# Patient Record
Sex: Male | Born: 1990 | Race: Black or African American | Hispanic: No | Marital: Married | Smoking: Current every day smoker
Health system: Southern US, Community
[De-identification: ages and names within clinical notes are randomized; demographics above are authoritative.]

## PROBLEM LIST (undated history)

## (undated) DIAGNOSIS — I10 Essential (primary) hypertension: Secondary | ICD-10-CM

## (undated) HISTORY — PX: FOOT SURGERY: SHX648

---
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
## 2011-10-26 ENCOUNTER — Emergency Department (HOSPITAL_COMMUNITY)
Admission: EM | Admit: 2011-10-26 | Discharge: 2011-10-26 | Disposition: A | Discharge status: Home / Self Care | Payer: No Typology Code available for payment source | Attending: Emergency Medicine | Admitting: Emergency Medicine

## 2011-10-26 ENCOUNTER — Emergency Department (HOSPITAL_COMMUNITY): Payer: Self-pay

## 2011-10-26 ENCOUNTER — Encounter (HOSPITAL_COMMUNITY): Payer: Self-pay | Admitting: *Deleted

## 2011-10-26 DIAGNOSIS — IMO0002 Reserved for concepts with insufficient information to code with codable children: Secondary | ICD-10-CM | POA: Insufficient documentation

## 2011-10-26 DIAGNOSIS — M25569 Pain in unspecified knee: Secondary | ICD-10-CM | POA: Insufficient documentation

## 2011-10-26 DIAGNOSIS — I10 Essential (primary) hypertension: Secondary | ICD-10-CM | POA: Insufficient documentation

## 2011-10-26 DIAGNOSIS — S8000XA Contusion of unspecified knee, initial encounter: Secondary | ICD-10-CM | POA: Insufficient documentation

## 2011-10-26 HISTORY — DX: Essential (primary) hypertension: I10

## 2011-10-26 MED ORDER — IBUPROFEN 600 MG PO TABS: 600.0000 mg | ORAL_TABLET | Freq: Four times a day (QID) | ORAL | Status: AC | PRN

## 2011-10-26 MED ORDER — TRAMADOL HCL 50 MG PO TABS: 50.0000 mg | ORAL_TABLET | Freq: Four times a day (QID) | ORAL | Status: AC | PRN
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
## 2016-09-17 ENCOUNTER — Other Ambulatory Visit (INDEPENDENT_AMBULATORY_CARE_PROVIDER_SITE_OTHER): Payer: PRIVATE HEALTH INSURANCE

## 2016-09-17 DIAGNOSIS — Z23 Encounter for immunization: Secondary | ICD-10-CM

## 2017-09-02 ENCOUNTER — Encounter: Payer: Self-pay | Admitting: Emergency Medicine

## 2017-09-02 ENCOUNTER — Emergency Department
Admission: EM | Admit: 2017-09-02 | Discharge: 2017-09-02 | Disposition: A | Discharge status: Home / Self Care | Payer: PRIVATE HEALTH INSURANCE | Attending: Emergency Medicine | Admitting: Emergency Medicine

## 2017-09-02 ENCOUNTER — Other Ambulatory Visit: Payer: Self-pay

## 2017-09-02 DIAGNOSIS — R1084 Generalized abdominal pain: Secondary | ICD-10-CM | POA: Insufficient documentation

## 2017-09-02 DIAGNOSIS — Z5321 Procedure and treatment not carried out due to patient leaving prior to being seen by health care provider: Secondary | ICD-10-CM | POA: Diagnosis not present

## 2017-09-02 DIAGNOSIS — R197 Diarrhea, unspecified: Secondary | ICD-10-CM | POA: Diagnosis not present

## 2017-09-02 DIAGNOSIS — R1110 Vomiting, unspecified: Secondary | ICD-10-CM | POA: Insufficient documentation

## 2017-09-02 LAB — COMPREHENSIVE METABOLIC PANEL
ALK PHOS: 68 U/L (ref 38–126)
ALT: 36 U/L (ref 17–63)
AST: 25 U/L (ref 15–41)
Albumin: 4.5 g/dL (ref 3.5–5.0)
Anion gap: 11 (ref 5–15)
BILIRUBIN TOTAL: 1.5 mg/dL — AB (ref 0.3–1.2)
BUN: 17 mg/dL (ref 6–20)
CALCIUM: 9 mg/dL (ref 8.9–10.3)
CO2: 22 mmol/L (ref 22–32)
CREATININE: 1.19 mg/dL (ref 0.61–1.24)
Chloride: 106 mmol/L (ref 101–111)
Glucose, Bld: 113 mg/dL — ABNORMAL HIGH (ref 65–99)
Potassium: 4.1 mmol/L (ref 3.5–5.1)
SODIUM: 139 mmol/L (ref 135–145)
Total Protein: 7.9 g/dL (ref 6.5–8.1)

## 2017-09-02 LAB — URINALYSIS, COMPLETE (UACMP) WITH MICROSCOPIC
Bacteria, UA: NONE SEEN
Bilirubin Urine: NEGATIVE
GLUCOSE, UA: NEGATIVE mg/dL
HGB URINE DIPSTICK: NEGATIVE
Ketones, ur: NEGATIVE mg/dL
Leukocytes, UA: NEGATIVE
Nitrite: NEGATIVE
PROTEIN: NEGATIVE mg/dL
Specific Gravity, Urine: 1.016 (ref 1.005–1.030)
Squamous Epithelial / LPF: NONE SEEN
pH: 7 (ref 5.0–8.0)

## 2017-09-02 LAB — LIPASE, BLOOD: Lipase: 20 U/L (ref 11–51)

## 2017-09-02 LAB — CBC
HCT: 46 % (ref 40.0–52.0)
Hemoglobin: 15.5 g/dL (ref 13.0–18.0)
MCH: 27.8 pg (ref 26.0–34.0)
MCHC: 33.7 g/dL (ref 32.0–36.0)
MCV: 82.4 fL (ref 80.0–100.0)
PLATELETS: 264 10*3/uL (ref 150–440)
RBC: 5.58 MIL/uL (ref 4.40–5.90)
RDW: 14.9 % — ABNORMAL HIGH (ref 11.5–14.5)
WBC: 12.1 10*3/uL — ABNORMAL HIGH (ref 3.8–10.6)

## 2017-09-02 NOTE — ED Triage Notes (Signed)
Pt reports vomiting, diarrhea and generalized abdominal pain after eating recalled hamburger meat two days ago.

## 2020-01-20 ENCOUNTER — Other Ambulatory Visit: Payer: Self-pay

## 2020-01-20 ENCOUNTER — Emergency Department (HOSPITAL_COMMUNITY)
Admission: EM | Admit: 2020-01-20 | Discharge: 2020-01-21 | Disposition: A | Discharge status: Home / Self Care | Payer: Self-pay | Attending: Emergency Medicine | Admitting: Emergency Medicine

## 2020-01-20 ENCOUNTER — Encounter (HOSPITAL_COMMUNITY): Payer: Self-pay | Admitting: Emergency Medicine

## 2020-01-20 DIAGNOSIS — R079 Chest pain, unspecified: Secondary | ICD-10-CM | POA: Insufficient documentation

## 2020-01-20 DIAGNOSIS — I10 Essential (primary) hypertension: Secondary | ICD-10-CM | POA: Insufficient documentation

## 2020-01-20 DIAGNOSIS — R1013 Epigastric pain: Secondary | ICD-10-CM | POA: Insufficient documentation

## 2020-01-20 DIAGNOSIS — F17210 Nicotine dependence, cigarettes, uncomplicated: Secondary | ICD-10-CM | POA: Insufficient documentation

## 2020-01-20 LAB — COMPREHENSIVE METABOLIC PANEL
ALT: 17 U/L (ref 0–44)
AST: 17 U/L (ref 15–41)
Albumin: 4.3 g/dL (ref 3.5–5.0)
Alkaline Phosphatase: 57 U/L (ref 38–126)
Anion gap: 8 (ref 5–15)
BUN: 14 mg/dL (ref 6–20)
CO2: 26 mmol/L (ref 22–32)
Calcium: 8.9 mg/dL (ref 8.9–10.3)
Chloride: 108 mmol/L (ref 98–111)
Creatinine, Ser: 1.47 mg/dL — ABNORMAL HIGH (ref 0.61–1.24)
GFR calc Af Amer: >60 mL/min (ref >60)
GFR calc non Af Amer: >60 mL/min (ref >60)
Glucose, Bld: 101 mg/dL — ABNORMAL HIGH (ref 70–99)
Potassium: 4.1 mmol/L (ref 3.5–5.1)
Sodium: 142 mmol/L (ref 135–145)
Total Bilirubin: 0.8 mg/dL (ref 0.3–1.2)
Total Protein: 7.5 g/dL (ref 6.5–8.1)

## 2020-01-20 LAB — CBC
HCT: 50.3 % (ref 39.0–52.0)
Hemoglobin: 16.5 g/dL (ref 13.0–17.0)
MCH: 28.2 pg (ref 26.0–34.0)
MCHC: 32.8 g/dL (ref 30.0–36.0)
MCV: 85.8 fL (ref 80.0–100.0)
Platelets: 298 10*3/uL (ref 150–400)
RBC: 5.86 MIL/uL — ABNORMAL HIGH (ref 4.22–5.81)
RDW: 13.9 % (ref 11.5–15.5)
WBC: 10.8 10*3/uL — ABNORMAL HIGH (ref 4.0–10.5)
nRBC: 0 % (ref 0.0–0.2)

## 2020-01-20 LAB — CBG MONITORING, ED: Glucose-Capillary: 112 mg/dL — ABNORMAL HIGH (ref 70–99)

## 2020-01-20 LAB — LIPASE, BLOOD: Lipase: 23 U/L (ref 11–51)

## 2020-01-20 MED ORDER — SODIUM CHLORIDE 0.9% FLUSH: 3.0000 mL | Freq: Once | INTRAVENOUS | Status: DC

## 2020-01-20 NOTE — ED Triage Notes (Signed)
Patient brought from home by [facility]. Patient is complaining of near syncope and abdominal pain. Patient got dysphoretic with orthostatic changes.

## 2020-01-21 ENCOUNTER — Encounter (HOSPITAL_COMMUNITY): Payer: Self-pay

## 2020-01-21 ENCOUNTER — Emergency Department (HOSPITAL_COMMUNITY): Payer: Self-pay

## 2020-01-21 LAB — URINALYSIS, ROUTINE W REFLEX MICROSCOPIC
Bilirubin Urine: NEGATIVE
Glucose, UA: NEGATIVE mg/dL
Hgb urine dipstick: NEGATIVE
Ketones, ur: NEGATIVE mg/dL
Leukocytes,Ua: NEGATIVE
Nitrite: NEGATIVE
Protein, ur: NEGATIVE mg/dL
Specific Gravity, Urine: 1.023 (ref 1.005–1.030)
pH: 6 (ref 5.0–8.0)

## 2020-01-21 LAB — TROPONIN I (HIGH SENSITIVITY)
Troponin I (High Sensitivity): <2 ng/L (ref <18)
Troponin I (High Sensitivity): <2 ng/L (ref <18)

## 2020-01-21 LAB — RAPID URINE DRUG SCREEN, HOSP PERFORMED
Amphetamines: POSITIVE — AB
Barbiturates: NOT DETECTED
Benzodiazepines: NOT DETECTED
Cocaine: POSITIVE — AB
Opiates: NOT DETECTED
Tetrahydrocannabinol: POSITIVE — AB

## 2020-01-21 LAB — D-DIMER, QUANTITATIVE: D-Dimer, Quant: 0.86 ug/mL-FEU — ABNORMAL HIGH (ref 0.00–0.50)

## 2020-01-21 MED ORDER — SODIUM CHLORIDE (PF) 0.9 % IJ SOLN
INTRAMUSCULAR | Status: AC
  Filled 2020-01-21: qty 50

## 2020-01-21 MED ORDER — IOHEXOL 350 MG/ML SOLN
100.0000 mL | Freq: Once | INTRAVENOUS | Status: AC | PRN
  Administered 2020-01-21: 100 mL via INTRAVENOUS

## 2020-01-21 NOTE — ED Notes (Signed)
Attempted to obtain blood work x2 with no success.  

## 2020-01-21 NOTE — ED Provider Notes (Signed)
[facility]-EMERGENCY DEPT Provider Note   CSN: [id number] Arrival date & time: 01/20/20  2153     History Chief Complaint  Patient presents with  . Near Syncope  . Abdominal Pain    Raymond Kent is a 29 y.o. male.  29 year old male with prior medical history detailed below presents for evaluation of abdominal pain.  Patient reports that this afternoon he had a Red Bull.  He reports that he does not normally drink Red Bull.  Shortly thereafter he developed mid epigastric abdominal and substernalchest pain.  This was associated with heavy sweating and feeling lightheaded.  He reports almost passing out. He denies associated shortness of breath.  He denies back pain.  Symptoms lasted approximately 45 minutes.  He now feels significantly better.  He denies significant prior medical history other than hypertension.  He denies cocaine use.  The history is provided by the patient and medical records.  Illness Location:  Abdominal pain, diaphoresis Severity:  Mild Onset quality:  Sudden Duration:  1 hour Timing:  Constant Progression:  Resolved Chronicity:  New Associated symptoms: abdominal pain        Past Medical History:  Diagnosis Date  . Hypertension     There are no problems to display for this patient.   History reviewed. No pertinent surgical history.     History reviewed. No pertinent family history.  Social History   Tobacco Use  . Smoking status: Current Every Day Smoker    Types: Cigarettes  . Smokeless tobacco: Never Used  Substance Use Topics  . Alcohol use: Yes  . Drug use: Not on file    Home Medications Prior to Admission medications   Not on File    Allergies    Patient has no known allergies.  Review of Systems   Review of Systems  Respiratory: Positive for chest tightness.   Gastrointestinal: Positive for abdominal pain.  All other systems reviewed and are negative.   Physical Exam Updated Vital Signs BP  (!) 137/93 (BP Location: Left Arm)   Pulse 77   Temp 98.1 F (36.7 C) (Oral)   Resp 17   Ht 5\' 11"  (1.803 m)   Wt 129.3 kg   SpO2 100%   BMI 39.75 kg/m   Physical Exam Vitals and nursing note reviewed.  Constitutional:      General: He is not in acute distress.    Appearance: He is well-developed.  HENT:     Head: Normocephalic and atraumatic.  Eyes:     Conjunctiva/sclera: Conjunctivae normal.     Pupils: Pupils are equal, round, and reactive to light.  Cardiovascular:     Rate and Rhythm: Normal rate and regular rhythm.     Heart sounds: Normal heart sounds.  Pulmonary:     Effort: Pulmonary effort is normal. No respiratory distress.     Breath sounds: Normal breath sounds.  Abdominal:     General: There is no distension.     Palpations: Abdomen is soft.     Tenderness: There is no abdominal tenderness.  Musculoskeletal:        General: No deformity. Normal range of motion.     Cervical back: Normal range of motion and neck supple.  Skin:    General: Skin is warm and dry.  Neurological:     General: No focal deficit present.     Mental Status: He is alert and oriented to person, place, and time.     ED Results /  Procedures / Treatments   Labs (all labs ordered are listed, but only abnormal results are displayed) Labs Reviewed  CBC - Abnormal; Notable for the following components:      Result Value   WBC 10.8 (*)    RBC 5.86 (*)    All other components within normal limits  COMPREHENSIVE METABOLIC PANEL - Abnormal; Notable for the following components:   Glucose, Bld 101 (*)    Creatinine, Ser 1.47 (*)    All other components within normal limits  D-DIMER, QUANTITATIVE (NOT AT [facility]) - Abnormal; Notable for the following components:   D-Dimer, Quant 0.86 (*)    All other components within normal limits  CBG MONITORING, ED - Abnormal; Notable for the following components:   Glucose-Capillary 112 (*)    All other components within normal limits  LIPASE,  BLOOD  URINALYSIS, ROUTINE W REFLEX MICROSCOPIC  RAPID URINE DRUG SCREEN, HOSP PERFORMED  TROPONIN I (HIGH SENSITIVITY)  TROPONIN I (HIGH SENSITIVITY)    EKG EKG Interpretation  Date/Time:  Wednesday January 20 2020 22:10:20 EDT Ventricular Rate:  68 PR Interval:    QRS Duration: 83 QT Interval:  366 QTC Calculation: 390 R Axis:   53 Text Interpretation: Sinus rhythm Nonspecific T abnormalities, inferior leads ST elevation, consider lateral injury No STEMI, no prior ecg for comparions Confirmed by Octaviano Glow [phone]) on 01/20/2020 10:21:36 PM   Radiology No results found.  Procedures Procedures (including critical care time)  Medications Ordered in ED Medications  sodium chloride flush (NS) 0.9 % injection 3 mL (has no administration in time range)    ED Course  I have reviewed the triage vital signs and the nursing notes.  Pertinent labs & imaging results that were available during my care of the patient were reviewed by me and considered in my medical decision making (see chart for details).    MDM Rules/Calculators/A&P                      MDM  Screen complete  Raymond Kent was evaluated in Emergency Department on 01/21/2020 for the symptoms described in the history of present illness. He was evaluated in the context of the global COVID-19 pandemic, which necessitated consideration that the patient might be at risk for infection with the SARS-CoV-2 virus that causes COVID-19. Institutional protocols and algorithms that pertain to the evaluation of patients at risk for COVID-19 are in a state of rapid change based on information released by regulatory bodies including the CDC and federal and state organizations. These policies and algorithms were followed during the patient's care in the ED.  Patient is presenting for evaluation of epigastric abdominal pain and chest pain with associated diaphoresis. Symptoms associated with near syncope. Onset of symptoms was shortly  after drinking a Red Bull.  Patient's described symptoms are largely resolved upon arrival to the ED.  EKG demonstrated T wave inversions in the inferior leads.  No prior EKG available for comparison.  Screening labs to be obtained - including Troponin.  Urine Tox ordered and pending.  Patient specifically denied cocaine use.  CT to RO Dissection pending.   Case signed out to following ED provider Kathrynn Humble).    Final Clinical Impression(s) / ED Diagnoses Final diagnoses:  Chest pain, unspecified type  Epigastric pain    Rx / DC Orders ED Discharge Orders    None       Valarie Merino, MD 01/21/20 [phone]

## 2020-01-21 NOTE — Discharge Instructions (Signed)

## 2020-01-21 NOTE — ED Provider Notes (Signed)
Patient comes to the ER with chief complaint of chest discomfort and near fainting spell.  He was seen by Dr. Rodena Medin, who has ordered labs which are all reassuring besides elevated D-dimer.  He recommends that patient get CT dissection, which if negative, patient can be discharged.  CT scan is negative and results have been discussed with the patient. The patient appears reasonably screened and/or stabilized for discharge and I doubt any other medical condition or other [facility] requiring further screening, evaluation, or treatment in the ED at this time prior to discharge.   Results from the ER workup discussed with the patient face to face and all questions answered to the best of my ability. The patient is safe for discharge with strict return precautions.    Derwood Kaplan, MD 01/21/20 [phone]

## 2021-06-29 ENCOUNTER — Encounter (HOSPITAL_COMMUNITY): Payer: Self-pay

## 2021-06-29 ENCOUNTER — Other Ambulatory Visit: Payer: Self-pay

## 2021-06-29 ENCOUNTER — Emergency Department (HOSPITAL_COMMUNITY)
Admission: EM | Admit: 2021-06-29 | Discharge: 2021-06-29 | Disposition: A | Discharge status: Home / Self Care | Payer: PRIVATE HEALTH INSURANCE | Attending: Emergency Medicine | Admitting: Emergency Medicine

## 2021-06-29 ENCOUNTER — Emergency Department (HOSPITAL_COMMUNITY): Payer: PRIVATE HEALTH INSURANCE

## 2021-06-29 DIAGNOSIS — E860 Dehydration: Secondary | ICD-10-CM | POA: Insufficient documentation

## 2021-06-29 DIAGNOSIS — U071 COVID-19: Secondary | ICD-10-CM | POA: Insufficient documentation

## 2021-06-29 DIAGNOSIS — I10 Essential (primary) hypertension: Secondary | ICD-10-CM | POA: Diagnosis not present

## 2021-06-29 DIAGNOSIS — R519 Headache, unspecified: Secondary | ICD-10-CM | POA: Diagnosis present

## 2021-06-29 DIAGNOSIS — F17210 Nicotine dependence, cigarettes, uncomplicated: Secondary | ICD-10-CM | POA: Diagnosis not present

## 2021-06-29 DIAGNOSIS — Z28310 Unvaccinated for covid-19: Secondary | ICD-10-CM | POA: Insufficient documentation

## 2021-06-29 LAB — BASIC METABOLIC PANEL
Anion gap: 11 (ref 5–15)
BUN: 12 mg/dL (ref 6–20)
CO2: 21 mmol/L — ABNORMAL LOW (ref 22–32)
Calcium: 8.6 mg/dL — ABNORMAL LOW (ref 8.9–10.3)
Chloride: 105 mmol/L (ref 98–111)
Creatinine, Ser: 1.43 mg/dL — ABNORMAL HIGH (ref 0.61–1.24)
GFR, Estimated: >60 mL/min (ref >60)
Glucose, Bld: 144 mg/dL — ABNORMAL HIGH (ref 70–99)
Potassium: 3.3 mmol/L — ABNORMAL LOW (ref 3.5–5.1)
Sodium: 137 mmol/L (ref 135–145)

## 2021-06-29 LAB — CBC WITH DIFFERENTIAL/PLATELET
Abs Immature Granulocytes: 0.09 10*3/uL — ABNORMAL HIGH (ref 0.00–0.07)
Basophils Absolute: 0 10*3/uL (ref 0.0–0.1)
Basophils Relative: 0 %
Eosinophils Absolute: 0 10*3/uL (ref 0.0–0.5)
Eosinophils Relative: 0 %
HCT: 44.5 % (ref 39.0–52.0)
Hemoglobin: 15.1 g/dL (ref 13.0–17.0)
Immature Granulocytes: 1 %
Lymphocytes Relative: 4 %
Lymphs Abs: 0.4 10*3/uL — ABNORMAL LOW (ref 0.7–4.0)
MCH: 28.3 pg (ref 26.0–34.0)
MCHC: 33.9 g/dL (ref 30.0–36.0)
MCV: 83.5 fL (ref 80.0–100.0)
Monocytes Absolute: 0.9 10*3/uL (ref 0.1–1.0)
Monocytes Relative: 9 %
Neutro Abs: 7.9 10*3/uL — ABNORMAL HIGH (ref 1.7–7.7)
Neutrophils Relative %: 86 %
Platelets: 239 10*3/uL (ref 150–400)
RBC: 5.33 MIL/uL (ref 4.22–5.81)
RDW: 14.3 % (ref 11.5–15.5)
WBC: 9.3 10*3/uL (ref 4.0–10.5)
nRBC: 0 % (ref 0.0–0.2)

## 2021-06-29 LAB — RESP PANEL BY RT-PCR (FLU A&B, COVID) ARPGX2
Influenza A by PCR: NEGATIVE
Influenza B by PCR: NEGATIVE
SARS Coronavirus 2 by RT PCR: POSITIVE — AB

## 2021-06-29 MED ORDER — ACETAMINOPHEN 500 MG PO TABS
1000.0000 mg | ORAL_TABLET | Freq: Once | ORAL | Status: AC
  Administered 2021-06-29: 1000 mg via ORAL
  Filled 2021-06-29: qty 2

## 2021-06-29 MED ORDER — SODIUM CHLORIDE 0.9 % IV BOLUS
1000.0000 mL | Freq: Once | INTRAVENOUS | Status: AC
  Administered 2021-06-29: 1000 mL via INTRAVENOUS

## 2021-06-29 MED ORDER — NIRMATRELVIR/RITONAVIR (PAXLOVID)TABLET: 3.0000 | ORAL_TABLET | Freq: Two times a day (BID) | ORAL | 0 refills | Status: AC

## 2021-06-29 NOTE — ED Provider Notes (Signed)
Emergency Medicine Provider Triage Evaluation Note  Raymond Kent , a 30 y.o. male  was evaluated in triage.  Pt complains of body aches, headache, fever, and chills.  He states that the symptoms started this morning.  He did not get vaccinated against COVID.  He denies history of COVID or any known COVID contacts.  He reports that he has mid to left-sided chest pain, that is about a 3 out of 10.  Does not radiate or move.  He has mild shortness of breath.  No rashes or wounds.  No abdominal pain.  Review of Systems  Positive: Fever, chills, headache, body ache.  Negative: Syncope  Physical Exam  BP (!) 161/99 (BP Location: Left Arm)   Pulse 91   Temp (!) 102.1 F (38.9 C) (Oral)   Resp 16   Ht 5\' 11"  (1.803 m)   Wt 133.8 kg   SpO2 100%   BMI 41.14 kg/m  Gen:   Awake, no distress   Resp:  Normal effort  MSK:   Moves extremities without difficulty  Other:  Patient is awake and alert.  Speech is not slurred  Medical Decision Making  Medically screening exam initiated at 7:03 PM.  Appropriate orders placed.  Raymond Kent was informed that the remainder of the evaluation will be completed by another provider, this initial triage assessment does not replace that evaluation, and the importance of remaining in the ED until their evaluation is complete.  Patient is a 30 year old man who presents today for evaluation of multiple symptoms, but sound concerning for COVID.  He is not vaccinated however denies any known contacts.  He has mild chest pain.  Given his degree of hypertension and his fever we will give him Tylenol, obtain twelve-lead and chest x-ray, and send COVID test anticipating repeat evaluation to see if his blood pressure and fever improve with Tylenol.  Note: Portions of this report may have been transcribed using voice recognition software. Every effort was made to ensure accuracy; however, inadvertent computerized transcription errors may be present    37 06/29/21 1910    07/01/21, MD 06/29/21 [phone]

## 2021-06-29 NOTE — ED Triage Notes (Addendum)
Patient reports when he woke this afternoon he had body aches, headache, and chills.  T.102.1 in triage

## 2021-06-29 NOTE — ED Provider Notes (Signed)
[facility]-EMERGENCY DEPT Provider Note   CSN: [id number] Arrival date & time: 06/29/21  1811     History Chief Complaint  Patient presents with   bodyaches   Chills   Headache   Fever    Raymond Kent is a 30 y.o. male history of hypertension, here presenting with body aches and headache and fever and chills.  Patient states that he woke up this morning and did not feel well.  He states that he is a [employer] but did not get COVID-vaccine.  He just complains of fever and chills and headaches.  Patient has a nonproductive cough as well.  The history is provided by the patient.      Past Medical History:  Diagnosis Date   Hypertension     There are no problems to display for this patient.   Past Surgical History:  Procedure Laterality Date   FOOT SURGERY Right        Family History  Problem Relation Age of Onset   Hypertension Father     Social History   Tobacco Use   Smoking status: Every Day    Packs/day: 0.50    Types: Cigarettes   Smokeless tobacco: Never  Vaping Use   Vaping Use: Former  Substance Use Topics   Alcohol use: Yes   Drug use: Never    Home Medications Prior to Admission medications   Not on File    Allergies    Patient has no known allergies.  Review of Systems   Review of Systems  Constitutional:  Positive for fever.  Neurological:  Positive for headaches.  All other systems reviewed and are negative.  Physical Exam Updated Vital Signs BP (!) 161/99 (BP Location: Left Arm)   Pulse 91   Temp 99.4 F (37.4 C) (Oral)   Resp 16   Ht 5\' 11"  (1.803 m)   Wt 133.8 kg   SpO2 100%   BMI 41.14 kg/m   Physical Exam Vitals and nursing note reviewed.  Constitutional:      Comments: Slightly dehydrated   HENT:     Head: Normocephalic.     Mouth/Throat:     Mouth: Mucous membranes are moist.  Eyes:     Extraocular Movements: Extraocular movements intact.     Pupils: Pupils are equal, round, and  reactive to light.  Cardiovascular:     Rate and Rhythm: Normal rate and regular rhythm.  Pulmonary:     Effort: Pulmonary effort is normal.     Breath sounds: Normal breath sounds.  Abdominal:     General: Bowel sounds are normal.     Palpations: Abdomen is soft.  Musculoskeletal:        General: Normal range of motion.  Skin:    General: Skin is warm.  Neurological:     Mental Status: He is alert and oriented to person, place, and time.  Psychiatric:        Mood and Affect: Mood normal.        Behavior: Behavior normal.    ED Results / Procedures / Treatments   Labs (all labs ordered are listed, but only abnormal results are displayed) Labs Reviewed  RESP PANEL BY RT-PCR (FLU A&B, COVID) ARPGX2 - Abnormal; Notable for the following components:      Result Value   SARS Coronavirus 2 by RT PCR POSITIVE (*)    All other components within normal limits  CBC WITH DIFFERENTIAL/PLATELET - Abnormal; Notable for the following  components:   Neutro Abs 7.9 (*)    Lymphs Abs 0.4 (*)    Abs Immature Granulocytes 0.09 (*)    All other components within normal limits  BASIC METABOLIC PANEL - Abnormal; Notable for the following components:   Potassium 3.3 (*)    CO2 21 (*)    Glucose, Bld 144 (*)    Creatinine, Ser 1.43 (*)    Calcium 8.6 (*)    All other components within normal limits    EKG EKG Interpretation  Date/Time:  Thursday June 29 2021 21:36:42 EDT Ventricular Rate:  72 PR Interval:  170 QRS Duration: 84 QT Interval:  344 QTC Calculation: 377 R Axis:   45 Text Interpretation: Sinus rhythm No significant change since last tracing Confirmed by Richardean Canal [phone]) on 06/29/2021 9:40:15 PM  Radiology DG Chest Portable 1 View  Result Date: 06/29/2021 CLINICAL DATA:  Chest pain EXAM: PORTABLE CHEST 1 VIEW COMPARISON:  None. FINDINGS: The heart size and mediastinal contours are within normal limits. Both lungs are clear. The visualized skeletal structures are  unremarkable. IMPRESSION: No active disease. Electronically Signed   By: Deatra Robinson M.D.   On: 06/29/2021 19:54    Procedures Procedures   Medications Ordered in ED Medications  acetaminophen (TYLENOL) tablet 1,000 mg (1,000 mg Oral Given 06/29/21 1924)  sodium chloride 0.9 % bolus 1,000 mL (1,000 mLs Intravenous New Bag/Given 06/29/21 2155)    ED Course  I have reviewed the triage vital signs and the nursing notes.  Pertinent labs & imaging results that were available during my care of the patient were reviewed by me and considered in my medical decision making (see chart for details).    MDM Rules/Calculators/A&P                          [name] is a 30 y.o. male here presenting with fevers and chills and cough.  Consider COVID versus pneumonia versus viral viral syndrome.  Patient is febrile on arrival. Will get COVID test and chest x-ray and labs.  Will hydrate and give Tylenol and reassess  10:32 PM Labs showed potassium of 3.3 and creatinine of 1.4.  Patient is hydrated in the ED.  COVID test is positive.  Since he has a history of hypertension, he will qualify for Paxlovid.  Patient does not have any oxygen requirement and does not need to be admitted.    Final Clinical Impression(s) / ED Diagnoses Final diagnoses:  None    Rx / DC Orders ED Discharge Orders     None        Charlynne Pander, MD 06/29/21 2232

## 2021-06-29 NOTE — Discharge Instructions (Signed)
You have COVID and you need to isolate for at least 5 days.  Please take Paxlovid as prescribed.  Take Tylenol and Motrin for fever  See your doctor for follow-up  Return to ER if you have worse trouble breathing, weakness, persistent fevers, vomiting

## 2021-12-04 IMAGING — CT CT ANGIO CHEST-ABD-PELV FOR DISSECTION W/ AND WO/W CM
3 of 13 series · 10 of 46 positions shown, 16 images · non-contrast
Comparison: None.

CLINICAL DATA: Aortic dissection suspected. Chest pain and back
pain. Near syncopal episode.

EXAM:
CT ANGIOGRAPHY CHEST, ABDOMEN AND PELVIS
TECHNIQUE: Non-contrast CT of the chest was initially obtained.

[Series 8: axial arterial · axial · arterial · 0.84mm/px · z∈[+1096,+1606]mm · 8 of 220 slices shown, 13 images]
[im 25/220  soft-tissue]
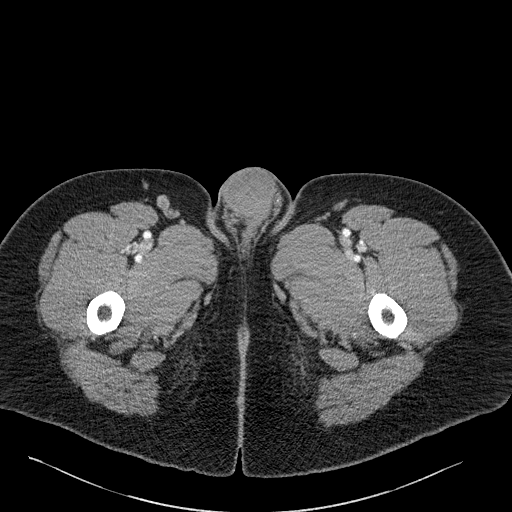
[im 25/220  bone]
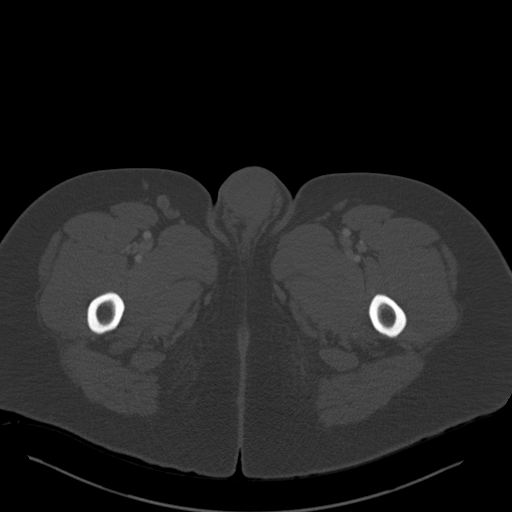
[im 49/220  soft-tissue]
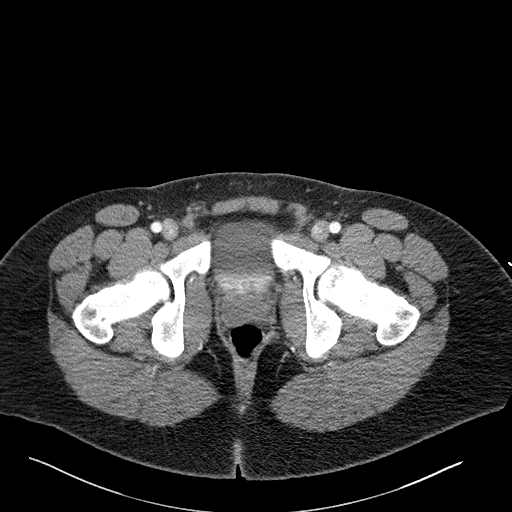
[im 74/220  soft-tissue]
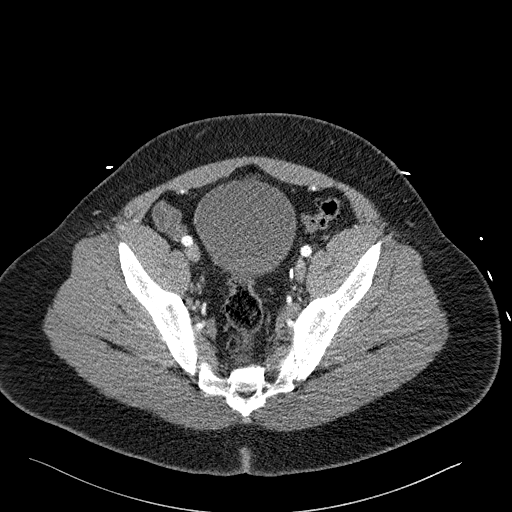
[im 98/220  soft-tissue]
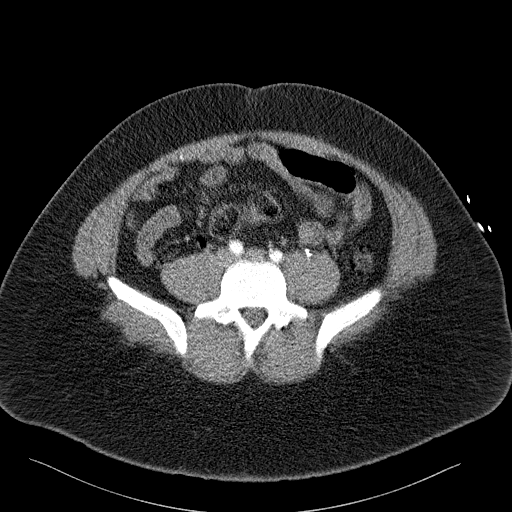
[im 122/220  soft-tissue]
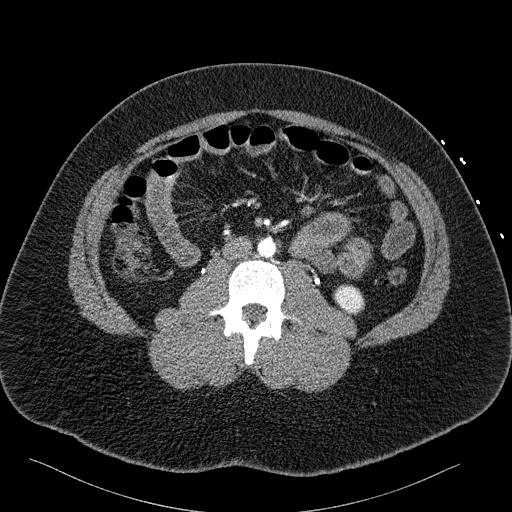
[im 122/220  lung]
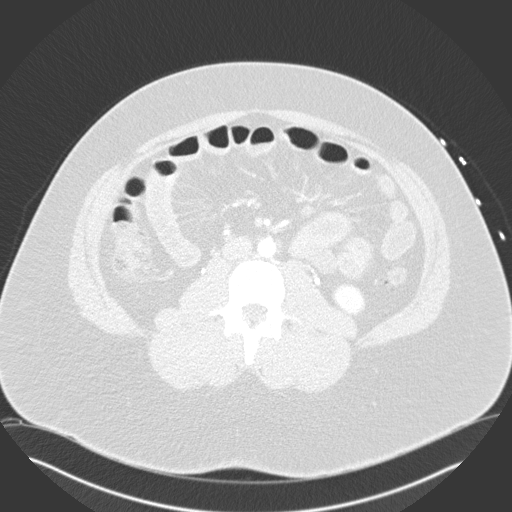
[im 147/220  soft-tissue]
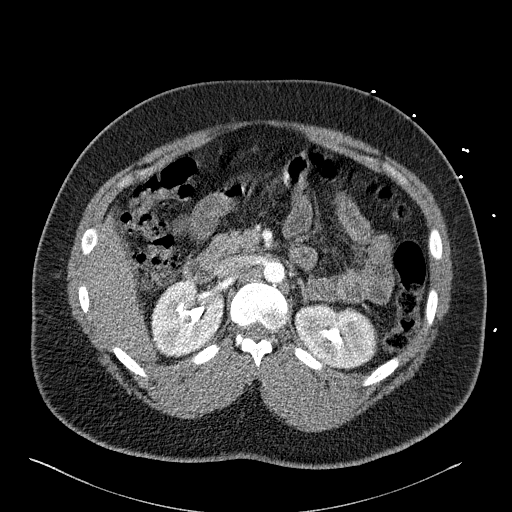
[im 147/220  lung]
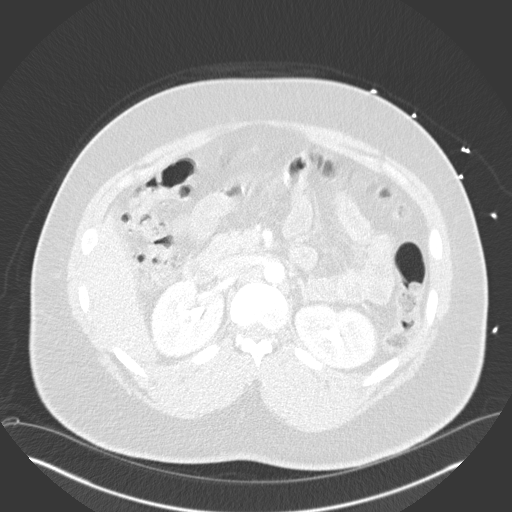
[im 171/220  soft-tissue]
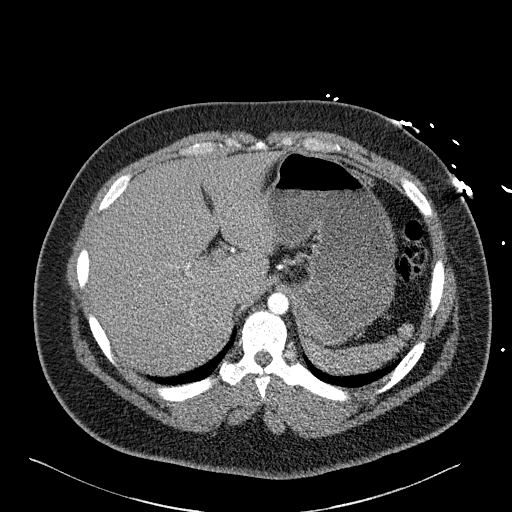
[im 171/220  lung]
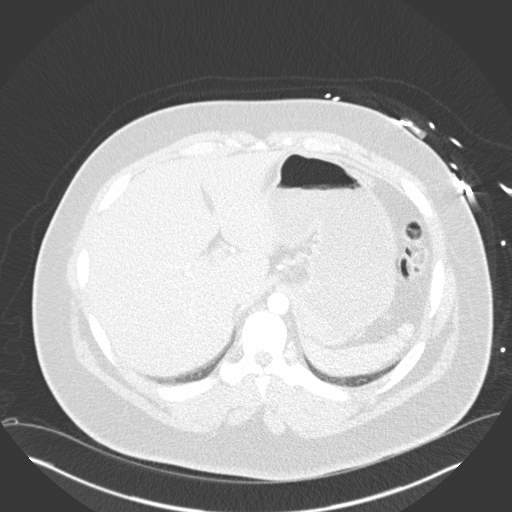
[im 195/220  soft-tissue]
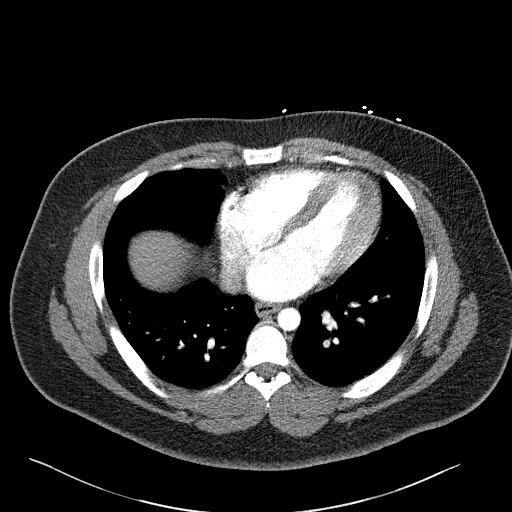
[im 195/220  lung]
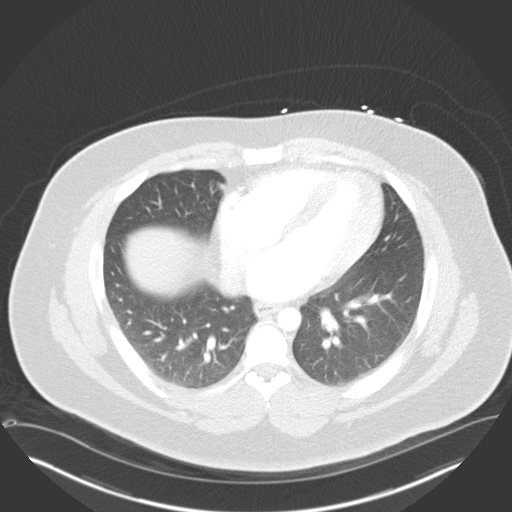

[Series 10: lung · axial · 0.84mm/px · 1 of 329 slices shown]
[im 22/329  bone]
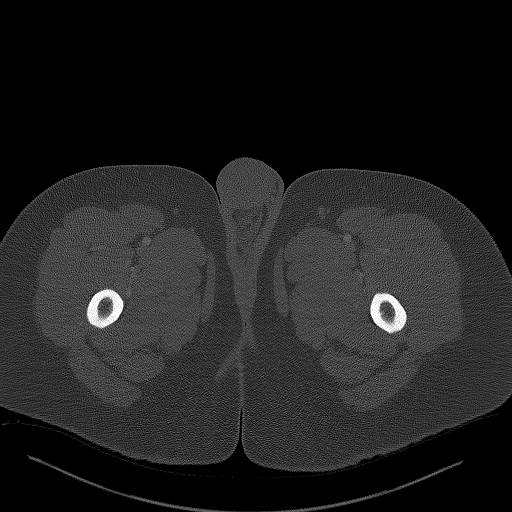

[Series 12: coronals · coronal · 0.86mm/px · 1 of 163 slices shown, 2 images]
[im 82/163  soft-tissue]
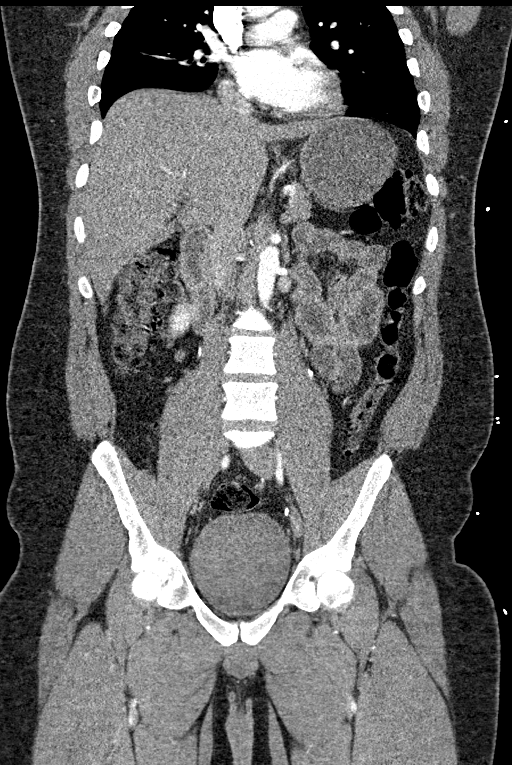
[im 82/163  bone]
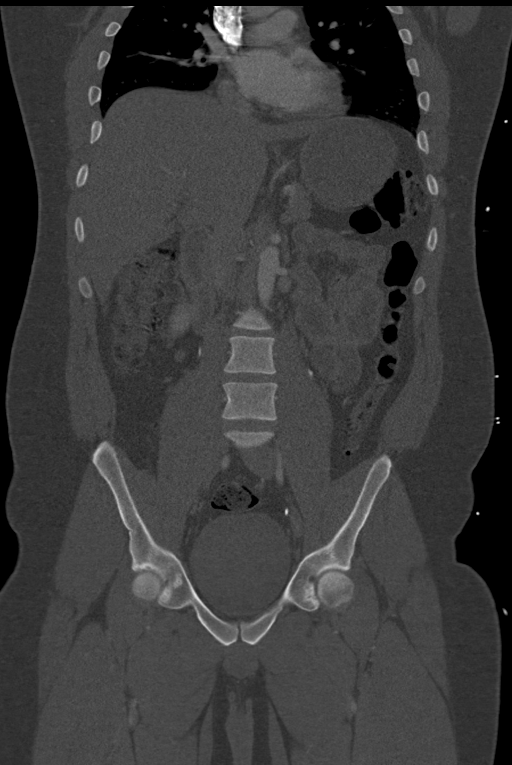

[10 of 46 positions shown; findings below may reference images not displayed]

Multidetector CT imaging through the chest, abdomen and pelvis was
performed using the standard protocol during bolus administration of
intravenous contrast. Multiplanar reconstructed images and MIPs were
obtained and reviewed to evaluate the vascular anatomy.

CONTRAST:  100mL OMNIPAQUE IOHEXOL 350 MG/ML SOLN
FINDINGS: CTA CHEST FINDINGS

Cardiovascular: There is no evidence for a thoracic aortic
dissection or aneurysm. No evidence for large pulmonary embolism.
The heart size is essentially normal. There is no significant
pericardial effusion.

Mediastinum/Nodes:

--No mediastinal or hilar lymphadenopathy.

--No axillary lymphadenopathy.

--No supraclavicular lymphadenopathy.

--Normal thyroid gland.

--The esophagus is unremarkable

Lungs/Pleura: No pulmonary nodules or masses. No pleural effusion or
pneumothorax. No focal airspace consolidation. No focal pleural
abnormality.

Musculoskeletal: No chest wall abnormality. No acute or significant
osseous findings.

Review of the MIP images confirms the above findings.

CTA ABDOMEN AND PELVIS FINDINGS

VASCULAR

Aorta: Normal caliber aorta without aneurysm, dissection, vasculitis
or significant stenosis.

Celiac: Patent without evidence of aneurysm, dissection, vasculitis
or significant stenosis.

SMA: Patent without evidence of aneurysm, dissection, vasculitis or
significant stenosis.

Renals: Both renal arteries are patent without evidence of aneurysm,
dissection, vasculitis, fibromuscular dysplasia or significant
stenosis.

IMA: Patent without evidence of aneurysm, dissection, vasculitis or
significant stenosis.

Inflow: Patent without evidence of aneurysm, dissection, vasculitis
or significant stenosis.

Veins: No obvious venous abnormality within the limitations of this
arterial phase study.

Review of the MIP images confirms the above findings.

NON-VASCULAR

Hepatobiliary: The liver is normal. Normal gallbladder.There is no
biliary ductal dilation.

Pancreas: Normal contours without ductal dilatation. No
peripancreatic fluid collection.

Spleen: Unremarkable.

Adrenals/Urinary Tract:

--Adrenal glands: Unremarkable.

--Right kidney/ureter: No hydronephrosis or radiopaque kidney
stones.

--Left kidney/ureter: No hydronephrosis or radiopaque kidney stones.

--Urinary bladder: Unremarkable.

Stomach/Bowel:

--Stomach/Duodenum: No hiatal hernia or other gastric abnormality.
Normal duodenal course and caliber.

--Small bowel: Unremarkable.

--Colon: Unremarkable.

--Appendix: Normal.

Lymphatic:

--No retroperitoneal lymphadenopathy.

--No mesenteric lymphadenopathy.

--No pelvic or inguinal lymphadenopathy.

Reproductive: Unremarkable

Other: No ascites or free air. The abdominal wall is normal.

Musculoskeletal. No acute displaced fractures.

Review of the MIP images confirms the above findings.
IMPRESSION: 1. Study degraded by motion artifact.
2. No acute thoracic, abdominal or pelvic pathology. Specifically,
no evidence for aortic dissection or aneurysm.
3. No large pulmonary embolism.

## 2023-05-13 IMAGING — DX DG CHEST 1V PORT
1 series · 1 of 1 positions shown · non-contrast
Comparison: None.

CLINICAL DATA: Chest pain

EXAM:
PORTABLE CHEST 1 VIEW

[chest ap]
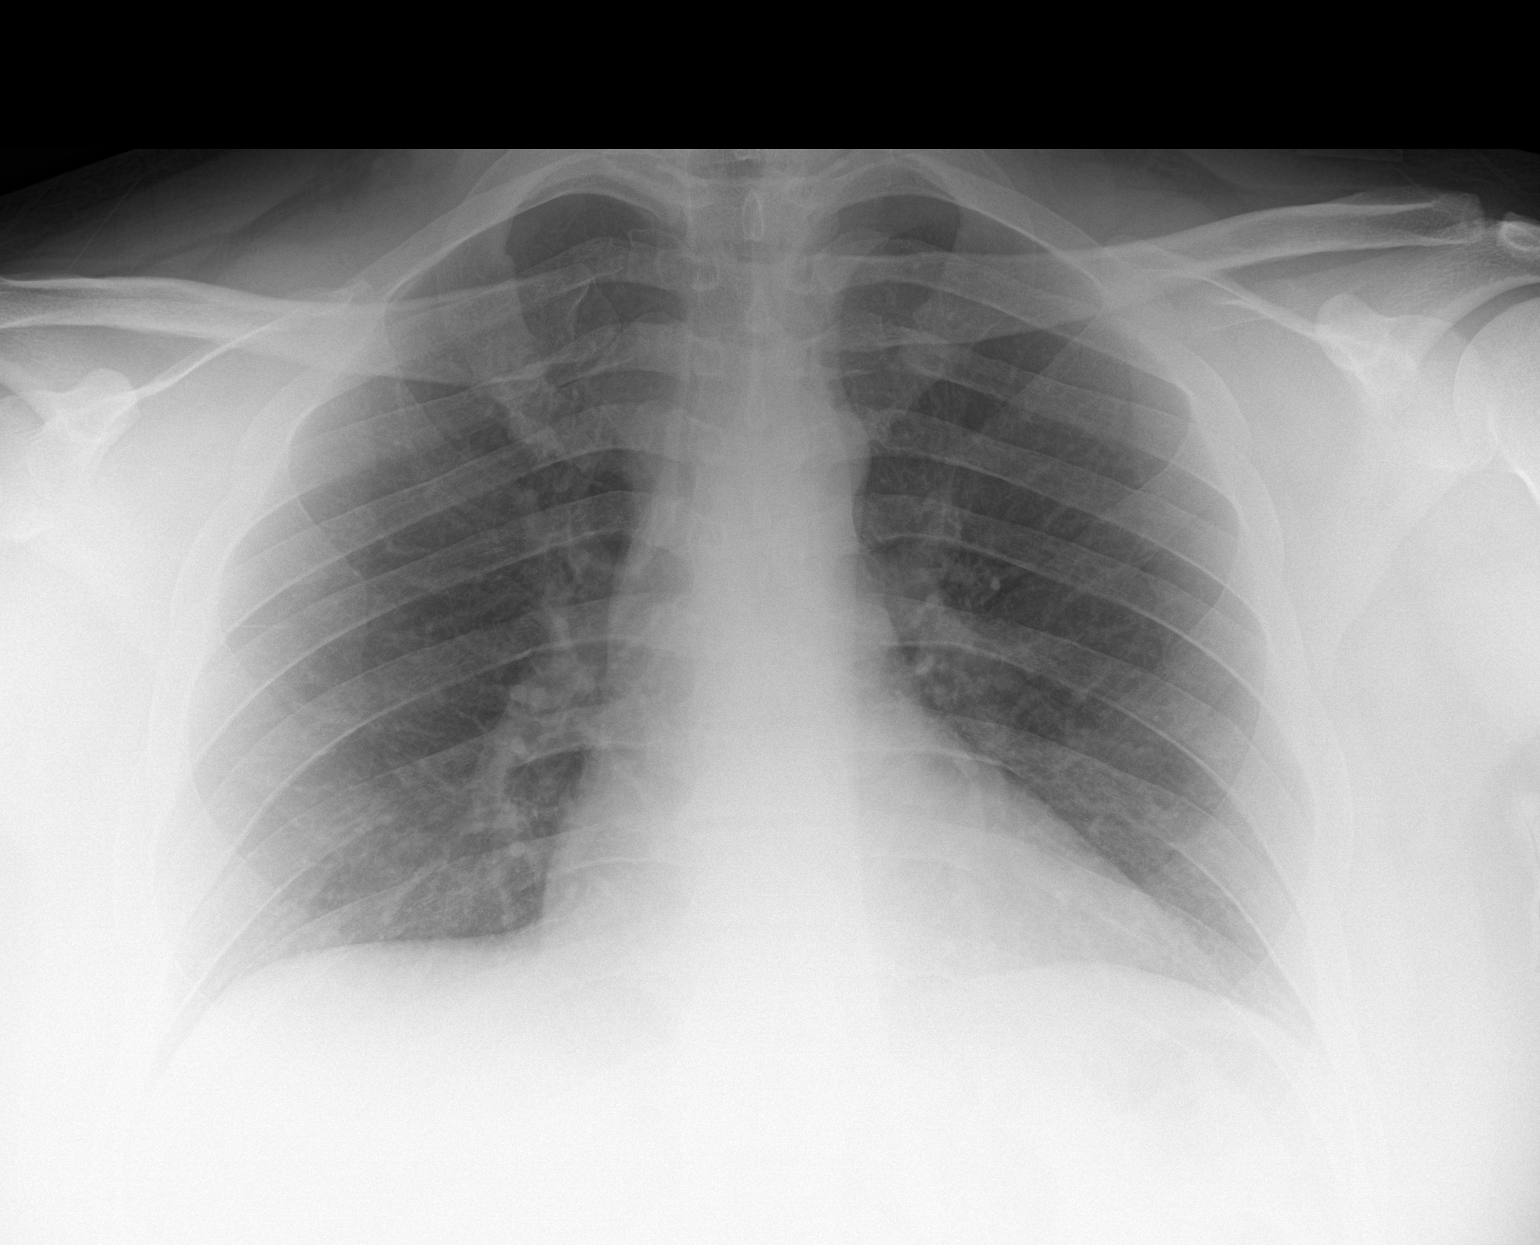

[1 of 1 positions shown; findings below may reference images not displayed]

FINDINGS: The heart size and mediastinal contours are within normal limits.
Both lungs are clear. The visualized skeletal structures are
unremarkable.
IMPRESSION: No active disease.

## 2024-01-18 ENCOUNTER — Emergency Department (HOSPITAL_COMMUNITY)
Admission: EM | Admit: 2024-01-18 | Discharge: 2024-01-18 | Disposition: A | Discharge status: Home / Self Care | Payer: PRIVATE HEALTH INSURANCE | Attending: Emergency Medicine | Admitting: Emergency Medicine

## 2024-01-18 DIAGNOSIS — R002 Palpitations: Secondary | ICD-10-CM | POA: Insufficient documentation

## 2024-01-18 DIAGNOSIS — I10 Essential (primary) hypertension: Secondary | ICD-10-CM | POA: Insufficient documentation

## 2024-01-18 DIAGNOSIS — R55 Syncope and collapse: Secondary | ICD-10-CM | POA: Insufficient documentation

## 2024-01-18 DIAGNOSIS — R079 Chest pain, unspecified: Secondary | ICD-10-CM | POA: Insufficient documentation

## 2024-01-18 LAB — CBC WITH DIFFERENTIAL/PLATELET
Abs Immature Granulocytes: 0.09 10*3/uL — ABNORMAL HIGH (ref 0.00–0.07)
Basophils Absolute: 0 10*3/uL (ref 0.0–0.1)
Basophils Relative: 1 %
Eosinophils Absolute: 0.2 10*3/uL (ref 0.0–0.5)
Eosinophils Relative: 3 %
HCT: 40.5 % (ref 39.0–52.0)
Hemoglobin: 13.5 g/dL (ref 13.0–17.0)
Immature Granulocytes: 1 %
Lymphocytes Relative: 23 %
Lymphs Abs: 2 10*3/uL (ref 0.7–4.0)
MCH: 27.5 pg (ref 26.0–34.0)
MCHC: 33.3 g/dL (ref 30.0–36.0)
MCV: 82.5 fL (ref 80.0–100.0)
Monocytes Absolute: 0.7 10*3/uL (ref 0.1–1.0)
Monocytes Relative: 8 %
Neutro Abs: 5.8 10*3/uL (ref 1.7–7.7)
Neutrophils Relative %: 64 %
Platelets: 311 10*3/uL (ref 150–400)
RBC: 4.91 MIL/uL (ref 4.22–5.81)
RDW: 14.1 % (ref 11.5–15.5)
WBC: 8.8 10*3/uL (ref 4.0–10.5)
nRBC: 0 % (ref 0.0–0.2)

## 2024-01-18 LAB — BASIC METABOLIC PANEL WITH GFR
Anion gap: 6 (ref 5–15)
BUN: 18 mg/dL (ref 6–20)
CO2: 24 mmol/L (ref 22–32)
Calcium: 8.7 mg/dL — ABNORMAL LOW (ref 8.9–10.3)
Chloride: 108 mmol/L (ref 98–111)
Creatinine, Ser: 1.24 mg/dL (ref 0.61–1.24)
GFR, Estimated: >60 mL/min (ref >60)
Glucose, Bld: 120 mg/dL — ABNORMAL HIGH (ref 70–99)
Potassium: 3.6 mmol/L (ref 3.5–5.1)
Sodium: 138 mmol/L (ref 135–145)

## 2024-01-18 LAB — TROPONIN I (HIGH SENSITIVITY)
Troponin I (High Sensitivity): 3 ng/L (ref <18)
Troponin I (High Sensitivity): 5 ng/L (ref <18)

## 2024-01-18 MED ORDER — LACTATED RINGERS IV BOLUS
1000.0000 mL | Freq: Once | INTRAVENOUS | Status: AC
  Administered 2024-01-18: 1000 mL via INTRAVENOUS

## 2024-01-18 NOTE — ED Provider Notes (Signed)
 [place] EMERGENCY DEPARTMENT AT [facility] Provider Note   CSN: [id number] Arrival date & time: 01/18/24  1720     History No chief complaint on file.   HPI Raymond Kent is a 33 y.o. male presenting for syncopal events.  States that he was sitting outside in the sun when he felt himself get very warm and flushed.  Had a prodrome about 5 minutes where he tried to drink water but then syncopized.  States he had some palpitations and chest pain when he woke up but these are improving at this time.  Denies any medical history other than hypertension..   Patient's recorded medical, surgical, social, medication list and allergies were reviewed in the Snapshot window as part of the initial history.   Review of Systems   Review of Systems  Constitutional:  Negative for chills and fever.  HENT:  Negative for ear pain and sore throat.   Eyes:  Negative for pain and visual disturbance.  Respiratory:  Negative for cough and shortness of breath.   Cardiovascular:  Positive for chest pain. Negative for palpitations.  Gastrointestinal:  Negative for abdominal pain and vomiting.  Genitourinary:  Negative for dysuria and hematuria.  Musculoskeletal:  Negative for arthralgias and back pain.  Skin:  Negative for color change and rash.  Neurological:  Positive for syncope. Negative for seizures.  All other systems reviewed and are negative.   Physical Exam Updated Vital Signs BP (!) 148/71 (BP Location: Right Arm)   Pulse (!) 57   Temp 98.2 F (36.8 C) (Oral)   Resp 18   SpO2 100%  Physical Exam Vitals and nursing note reviewed.  Constitutional:      General: He is not in acute distress.    Appearance: He is well-developed.  HENT:     Head: Normocephalic and atraumatic.  Eyes:     Conjunctiva/sclera: Conjunctivae normal.  Cardiovascular:     Rate and Rhythm: Normal rate and regular rhythm.     Heart sounds: No murmur heard. Pulmonary:     Effort: Pulmonary effort is  normal. No respiratory distress.     Breath sounds: Normal breath sounds.  Abdominal:     Palpations: Abdomen is soft.     Tenderness: There is no abdominal tenderness.  Musculoskeletal:        General: No swelling.     Cervical back: Neck supple.  Skin:    General: Skin is warm and dry.     Capillary Refill: Capillary refill takes less than 2 seconds.  Neurological:     Mental Status: He is alert.  Psychiatric:        Mood and Affect: Mood normal.      ED Course/ Medical Decision Making/ A&P    Procedures Procedures   Medications Ordered in ED Medications  lactated ringers bolus 1,000 mL (0 mLs Intravenous Stopped 01/18/24 2007)   Medical Decision Making: FINBAR NIPPERT is a 33 y.o. male who presented to the ED today with chest pain, detailed above.  Based on patient's comorbidities, patient has a heart score of 3.    Handoff received from EMS.  Additional history discussed with patient's family/caregivers.  Patient placed on continuous vitals and telemetry monitoring while in ED which was reviewed periodically.  Complete initial physical exam performed, notably the patient was HDS in NAD.   Reviewed and confirmed nursing documentation for past medical history, family history, social history.    Initial Assessment: With the patient's presentation of  left-sided chest pain, most likely diagnosis is musculoskeletal chest pain versus GERD, although ACS remains on the differential. Other diagnoses were considered including (but not limited to) pulmonary embolism, community-acquired pneumonia, aortic dissection, pneumothorax, underlying bony abnormality, anemia. These are considered less likely due to history of present illness and physical exam findings.    In particular, concerning pulmonary embolism: Patient is PERC Negative and the they deny malignancy, recent surgery, history of DVT, or calf tenderness leading to a LOW risk Wells score. Aortic Dissection also reconsidered but  seems less likely based on the location, quality, onset, and severity of symptoms in this case. Patient also has a lack of underlying history of AD or TAA.  This is most consistent with an acute life/limb threatening illness complicated by underlying chronic conditions.   Initial Plan: Evaluate for ACS with delta troponin and EKG evaluated as below  Evaluate for dissection, bony abnormality, or pneumonia with screening laboratory evaluation including CBC, BMP  Further evaluation for pulmonary embolism not indicated at this time based on patient's PERC and Wells score.  Further evaluation for Thoracic Aortic Dissection not indicated at this time based on patient's clinical history and PE findings.   Initial Study Results: EKG was reviewed independently. Rate, rhythm, axis, intervals all examined and without medically relevant abnormality. ST segments without concerns for elevations.    Laboratory  Delta troponin demonstrated NAA CBC and BMP without obvious metabolic or inflammatory abnormalities requiring further evaluation   Final Assessment and Plan: Studies negative for acute pathology.  Patient observed in the ER for 4 and half hours.  No focal events on telemetry.  Vital signs remain normal.  Symptoms remain resolved.  Patient stable for outpatient setting given low heart score, resolution of symptoms and overall well appearance at this time.  Patient expressed agreement accepted will follow-up with PCP.       Clinical Impression:  1. Syncope and collapse      Discharge   Final Clinical Impression(s) / ED Diagnoses Final diagnoses:  Syncope and collapse    Rx / DC Orders ED Discharge Orders     None         Glyn Ade, MD 01/18/24 2251

## 2024-01-18 NOTE — ED Triage Notes (Signed)
 EMS from home. Pt reports feeling dizzy, tired, dehydrated and no appetite. Reports syncopal episode. Hx HTN.   HR 90  RR 18 BP 112/67 CBG 109 98 RA
